# Patient Record
Sex: Female | Born: 1955 | Race: White | Hispanic: No
Health system: Southern US, Community
[De-identification: ages and names within clinical notes are randomized; demographics above are authoritative.]

---

## 2016-08-31 ENCOUNTER — Emergency Department (HOSPITAL_COMMUNITY): Payer: Medicare (Managed Care) | Admitting: Registered Nurse

## 2016-08-31 ENCOUNTER — Emergency Department (HOSPITAL_COMMUNITY): Payer: Medicare (Managed Care)

## 2016-08-31 ENCOUNTER — Emergency Department (HOSPITAL_COMMUNITY)
Admission: EM | Admit: 2016-08-31 | Discharge: 2016-08-31 | Disposition: A | Discharge status: Home / Self Care | Payer: Medicare (Managed Care) | Attending: Emergency Medicine | Admitting: Emergency Medicine

## 2016-08-31 ENCOUNTER — Encounter (HOSPITAL_COMMUNITY)
Admission: EM | Disposition: A | Discharge status: Home / Self Care | Payer: Self-pay | Source: Home / Self Care | Attending: Emergency Medicine

## 2016-08-31 DIAGNOSIS — Z96643 Presence of artificial hip joint, bilateral: Secondary | ICD-10-CM | POA: Insufficient documentation

## 2016-08-31 DIAGNOSIS — Z79899 Other long term (current) drug therapy: Secondary | ICD-10-CM | POA: Diagnosis not present

## 2016-08-31 DIAGNOSIS — F17200 Nicotine dependence, unspecified, uncomplicated: Secondary | ICD-10-CM | POA: Insufficient documentation

## 2016-08-31 DIAGNOSIS — W19XXXA Unspecified fall, initial encounter: Secondary | ICD-10-CM | POA: Insufficient documentation

## 2016-08-31 DIAGNOSIS — Y93E9 Activity, other interior property and clothing maintenance: Secondary | ICD-10-CM | POA: Diagnosis not present

## 2016-08-31 DIAGNOSIS — T84020A Dislocation of internal right hip prosthesis, initial encounter: Secondary | ICD-10-CM | POA: Diagnosis not present

## 2016-08-31 DIAGNOSIS — Y792 Prosthetic and other implants, materials and accessory orthopedic devices associated with adverse incidents: Secondary | ICD-10-CM | POA: Insufficient documentation

## 2016-08-31 DIAGNOSIS — T84028A Dislocation of other internal joint prosthesis, initial encounter: Secondary | ICD-10-CM

## 2016-08-31 DIAGNOSIS — Z09 Encounter for follow-up examination after completed treatment for conditions other than malignant neoplasm: Secondary | ICD-10-CM

## 2016-08-31 DIAGNOSIS — J449 Chronic obstructive pulmonary disease, unspecified: Secondary | ICD-10-CM | POA: Diagnosis not present

## 2016-08-31 DIAGNOSIS — Z96649 Presence of unspecified artificial hip joint: Secondary | ICD-10-CM

## 2016-08-31 DIAGNOSIS — Z7951 Long term (current) use of inhaled steroids: Secondary | ICD-10-CM | POA: Insufficient documentation

## 2016-08-31 DIAGNOSIS — M25559 Pain in unspecified hip: Secondary | ICD-10-CM

## 2016-08-31 DIAGNOSIS — S79911A Unspecified injury of right hip, initial encounter: Secondary | ICD-10-CM | POA: Diagnosis present

## 2016-08-31 DIAGNOSIS — S73004A Unspecified dislocation of right hip, initial encounter: Secondary | ICD-10-CM

## 2016-08-31 HISTORY — PX: HIP CLOSED REDUCTION: SHX983

## 2016-08-31 SURGERY — CLOSED MANIPULATION, JOINT, HIP
Anesthesia: General | Laterality: Right

## 2016-08-31 MED ORDER — FENTANYL CITRATE (PF) 100 MCG/2ML IJ SOLN
INTRAMUSCULAR | Status: AC
  Filled 2016-08-31: qty 2

## 2016-08-31 MED ORDER — HYDROMORPHONE HCL 2 MG/ML IJ SOLN
1.0000 mg | Freq: Once | INTRAMUSCULAR | Status: AC
  Administered 2016-08-31: 1 mg via INTRAVENOUS
  Filled 2016-08-31: qty 1

## 2016-08-31 MED ORDER — MEPERIDINE HCL 25 MG/ML IJ SOLN: 6.2500 mg | INTRAMUSCULAR | Status: DC | PRN

## 2016-08-31 MED ORDER — FENTANYL CITRATE (PF) 100 MCG/2ML IJ SOLN
50.0000 ug | Freq: Once | INTRAMUSCULAR | Status: AC
  Administered 2016-08-31: 50 ug via INTRAVENOUS
  Filled 2016-08-31: qty 2

## 2016-08-31 MED ORDER — PROPOFOL 10 MG/ML IV BOLUS
1.0000 mg/kg | Freq: Once | INTRAVENOUS | Status: DC
  Filled 2016-08-31: qty 20

## 2016-08-31 MED ORDER — HYDROMORPHONE HCL 2 MG/ML IJ SOLN: 1.0000 mg | Freq: Once | INTRAMUSCULAR | Status: DC

## 2016-08-31 MED ORDER — SUCCINYLCHOLINE CHLORIDE 200 MG/10ML IV SOSY
PREFILLED_SYRINGE | INTRAVENOUS | Status: DC | PRN
  Administered 2016-08-31: 80 mg via INTRAVENOUS

## 2016-08-31 MED ORDER — SUCCINYLCHOLINE CHLORIDE 200 MG/10ML IV SOSY
PREFILLED_SYRINGE | INTRAVENOUS | Status: AC
  Filled 2016-08-31: qty 10

## 2016-08-31 MED ORDER — PROPOFOL 10 MG/ML IV BOLUS
INTRAVENOUS | Status: AC
  Filled 2016-08-31: qty 20

## 2016-08-31 MED ORDER — PROMETHAZINE HCL 25 MG/ML IJ SOLN
6.2500 mg | INTRAMUSCULAR | Status: DC | PRN
  Filled 2016-08-31: qty 1

## 2016-08-31 MED ORDER — HYDROMORPHONE HCL 2 MG/ML IJ SOLN: 0.2500 mg | INTRAMUSCULAR | Status: DC | PRN

## 2016-08-31 MED ORDER — ONDANSETRON HCL 4 MG/2ML IJ SOLN
INTRAMUSCULAR | Status: AC
  Filled 2016-08-31: qty 2

## 2016-08-31 MED ORDER — LACTATED RINGERS IV SOLN
INTRAVENOUS | Status: DC | PRN
  Administered 2016-08-31: 15:00:00 via INTRAVENOUS

## 2016-08-31 MED ORDER — FENTANYL CITRATE (PF) 100 MCG/2ML IJ SOLN
INTRAMUSCULAR | Status: DC | PRN
  Administered 2016-08-31: 50 ug via INTRAVENOUS

## 2016-08-31 MED ORDER — LIDOCAINE HCL (CARDIAC) 20 MG/ML IV SOLN
INTRAVENOUS | Status: DC | PRN
  Administered 2016-08-31: 100 mg via INTRAVENOUS

## 2016-08-31 MED ORDER — PROPOFOL 10 MG/ML IV BOLUS
INTRAVENOUS | Status: DC | PRN
Start: 2016-08-31 — End: 2016-08-31
  Administered 2016-08-31: 140 mg via INTRAVENOUS

## 2016-08-31 MED ORDER — ONDANSETRON HCL 4 MG/2ML IJ SOLN
INTRAMUSCULAR | Status: DC | PRN
  Administered 2016-08-31: 4 mg via INTRAVENOUS

## 2016-08-31 MED ORDER — LIDOCAINE 2% (20 MG/ML) 5 ML SYRINGE
INTRAMUSCULAR | Status: AC
  Filled 2016-08-31: qty 5

## 2016-08-31 MED ORDER — PROPOFOL 10 MG/ML IV BOLUS
0.5000 mg/kg | Freq: Once | INTRAVENOUS | Status: DC
  Filled 2016-08-31: qty 20

## 2016-08-31 MED ORDER — LACTATED RINGERS IV SOLN: INTRAVENOUS | Status: DC

## 2016-08-31 SURGICAL SUPPLY — 6 items
BANDAGE ADH SHEER 1  50/CT (GAUZE/BANDAGES/DRESSINGS) IMPLANT
GAUZE SPONGE 4X4 12PLY STRL (GAUZE/BANDAGES/DRESSINGS) IMPLANT
GLOVE ORTHO TXT STRL SZ7.5 (GLOVE) IMPLANT
NDL SAFETY ECLIPSE 18X1.5 (NEEDLE) IMPLANT
NEEDLE HYPO 18GX1.5 SHARP (NEEDLE)
SYR CONTROL 10ML LL (SYRINGE) IMPLANT

## 2016-08-31 NOTE — Consult Note (Signed)
   Reason for Consult:4th or 5th time right THA dislocation, not able to be reduced under conscious sedation Referring Physician: Dalene SeltzerSchlossman MD  Hailey AltesLaura Robles is an 60 y.o. female.  HPI:  60 yo female THA done in [place] with 3 dislocations there, one in [place] with spinal or epidural anesthethia to reduce last time.  She was cleaning windows and fell with hip dislocation. On chronic percocet 10/325 3 or 4 a day for pain. Is scheduled for hip revision in [place] coming up. [place] in [place] when she fell.   No past medical history on file.Hx of bilat THA done in [place] . States she has chronic LBP  No past surgical history on file.see above  No family history on file. noncontributory . No Hx of anesthetic problems  Social History:  has no tobacco, alcohol, and drug history on file. positive for chronic narcotic pain medication use .   Allergies: No Known Allergies  Medications: I have reviewed the patient's current medications.  No results found for this or any previous visit (from the past 48 hour(s)).  Dg Hip Unilat With Pelvis 2-3 Views Right  Result Date: 08/31/2016 CLINICAL DATA:  Fall with right hip pain. EXAM: DG HIP (WITH OR WITHOUT PELVIS) 2-3V RIGHT COMPARISON:  None. FINDINGS: Examination demonstrates bilateral total hip arthroplasties. Left hip arthroplasty is unremarkable. There is complete dislocation of the femoral component of the right arthroplasty superolateral to the acetabular component. No definite fracture identified. Degenerative change of the spine. IMPRESSION: Complete superolateral dislocation of the femoral component of the right hip arthroplasty. Electronically Signed   By: Elberta Fortisaniel  Boyle M.D.   On: 08/31/2016 12:30    Review of Systems  Constitutional: Negative.   HENT:       Glasses  Gastrointestinal: Negative.   Musculoskeletal: Positive for falls.       Positive for chronic LBP, bilat THA  Skin: Negative.   Neurological:  Negative for headaches.  Psychiatric/Behavioral: Negative for memory loss.   Blood pressure 126/86, pulse 62, temperature 98.4 F (36.9 C), temperature source Oral, resp. rate 12, weight 125 lb (56.7 kg), SpO2 97 %. Physical Exam  Constitutional: She is oriented to person, place, and time. She appears well-developed and well-nourished.  HENT:  Head: Normocephalic.  Eyes: Pupils are equal, round, and reactive to light.  Neck: Normal range of motion.  Cardiovascular: Normal rate and regular rhythm.   Respiratory: Effort normal. She has no wheezes.  GI: Soft.  Musculoskeletal:  Right LE short and ER.   Neurological: She is oriented to person, place, and time.  Skin: Skin is warm and dry.  Psychiatric:  Sedated from medication given here in ER    Assessment/Plan: Will proceed with reduction under general anesthesia with full muscle relaxant since attempts by ER MD's was unsuccessful .  Discussed with pt and her daughter at bedside who added in Hx for pt.   They agree to proceed.   Eldred MangesMark C Yates 08/31/2016, 2:48 PM

## 2016-08-31 NOTE — ED Notes (Signed)
Patient is arousable and answers questions coherently.  VS stable.  Daughter sitting at bedside waiting on Orthopedic surgeon to arrive at bedside.

## 2016-08-31 NOTE — ED Notes (Signed)
Ortho tech called and respiratory called to notify of procedure.

## 2016-08-31 NOTE — ED Notes (Signed)
Bed: [id number] Expected date:  Expected time:  Means of arrival:  Comments: Hip dislocation

## 2016-08-31 NOTE — Brief Op Note (Signed)
08/31/2016  3:33 PM  PATIENT:  Hailey Robles  60 y.o. female  PRE-OPERATIVE DIAGNOSIS:  dislocated right total hip  POST-OPERATIVE DIAGNOSIS:  same  PROCEDURE:  Procedure(s): CLOSED MANIPULATION HIP (Right)  Closed reduction under general anesthesia  SURGEON:  Surgeon(s) and Role:    * Eldred MangesMark C Sutton Hirsch, MD - Primary  PHYSICIAN ASSISTANT:   ASSISTANTS: none   ANESTHESIA:   general  EBL:  No intake/output data recorded.  BLOOD ADMINISTERED:none  DRAINS: none   LOCAL MEDICATIONS USED:  NONE  SPECIMEN:  No Specimen  DISPOSITION OF SPECIMEN:  N/A  COUNTS:  NO closed reduction ,  no sponges  TOURNIQUET:  * No tourniquets in log *  DICTATION: .Dragon Dictation  PLAN OF CARE: Discharge to home after PACU  PATIENT DISPOSITION:  PACU - hemodynamically stable.   Delay start of Pharmacological VTE agent (>24hrs) due to surgical blood loss or risk of bleeding: not applicable

## 2016-08-31 NOTE — ED Notes (Signed)
Crash cart at bedside, etco2 set up on the monitor, patient on cardiac monitor, suction set up, Ambu bag set up and hooked to O2.Marland Kitchen.  Applied 3 L O2 [place] to patient and increased to 6L O2 [place].  Sedation at bedside preformed.  Present at the procedure was Dr. Dalene SeltzerSchlossman, Dr. Freida BusmanAllen,  OT - [place]     And Lance BoschStephanie Lyndzee Kliebert, RN.   Time out called 0136 60 mg Propofol given at 0137 by Dr. Dalene SeltzerSchlossman 20mg  Propofol given at 0138 50 mcg of fentanyl given at 0139  Procedure to reduce started at 0140  Dr. Freida BusmanAllen entered for assistance at 0148  40mg  of propofol give at 0151  Procedure ended at 0155.

## 2016-08-31 NOTE — ED Provider Notes (Signed)
[facility]-EMERGENCY DEPT Provider Note   CSN: [id number] Arrival date & time: 08/31/16  [id number]     History   Chief Complaint No chief complaint on file.   HPI Hailey Robles is a 60 y.o. female.  HPI   History of bilateral hip replacements, with 4 dislocations of the right side including today.  Was turning and reaching, and felt her hip suddenly pop out of place of severe pain and deformity. Pain is worse with movement. She'll he had mild relief with fentanyl with EMS. Denies any other concerns or injuries.   No past medical history on file.  Patient Active Problem List   Diagnosis Date Noted  . 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  OB History    No data available       Home Medications    Prior to Admission medications   Medication Sig Start Date End Date Taking? Authorizing Provider  albuterol (PROVENTIL HFA;VENTOLIN HFA) 108 (90 Base) MCG/ACT inhaler Inhale 2 puffs into the lungs every 6 (six) hours as needed for wheezing or shortness of breath.   Yes Historical Provider, MD  Fluticasone-Salmeterol (ADVAIR) 100-50 MCG/DOSE AEPB Inhale 1 puff into the lungs 2 (two) times daily.   Yes Historical Provider, MD  oxyCODONE-acetaminophen (PERCOCET) 10-325 MG tablet Take 1 tablet by mouth every 6 (six) hours as needed for pain.   Yes Historical Provider, MD  pregabalin (LYRICA) 200 MG capsule Take 200 mg by mouth at bedtime.   Yes Historical Provider, MD    Family History No family history on file.  Social History Social History  Substance Use Topics  . Smoking status: Not on file  . Smokeless tobacco: Not on file  . Alcohol use Not on file     Allergies   Patient has no known allergies.   Review of Systems Review of Systems  Constitutional: Negative for fever.  HENT: Negative for sore throat.   Eyes: Negative for visual disturbance.  Respiratory: Negative for cough and shortness of breath.     Cardiovascular: Negative for chest pain.  Gastrointestinal: Negative for abdominal pain.  Genitourinary: Negative for difficulty urinating.  Musculoskeletal: Positive for arthralgias. Negative for back pain and neck pain.  Skin: Negative for rash.  Neurological: Negative for syncope and headaches.     Physical Exam Updated Vital Signs BP 104/84 (BP Location: Right Arm)   Pulse 67   Temp 97.8 F (36.6 C)   Resp 18   Ht 5\' 2"  (1.575 m)   Wt 125 lb (56.7 kg)   LMP  (Approximate)   SpO2 98%   BMI 22.86 kg/m   Physical Exam  Constitutional: She is oriented to person, place, and time. She appears well-developed and well-nourished. No distress.  HENT:  Head: Normocephalic and atraumatic.  Eyes: EOM are normal.  Neck: Normal range of motion.  Cardiovascular: Normal rate, regular rhythm, normal heart sounds and intact distal pulses.  Exam reveals no gallop and no friction rub.   No murmur heard. Pulmonary/Chest: Effort normal. No respiratory distress. She has wheezes. She has no rales.  Abdominal: Soft. She exhibits no distension.  Musculoskeletal: She exhibits no edema.       Right hip: She exhibits decreased range of motion, decreased strength, tenderness and deformity.  Neurological: She is alert and oriented to person, place, and time.  Skin: Skin is warm and dry. No rash noted. She is not diaphoretic. No erythema.  Nursing note  and vitals reviewed.    ED Treatments / Results  Labs (all labs ordered are listed, but only abnormal results are displayed) Labs Reviewed - No data to display  EKG  EKG Interpretation None       Radiology Dg Hip Port Unilat With Pelvis 1v Right  Result Date: 08/31/2016 CLINICAL DATA:  Recent right hip relocation EXAM: DG HIP (WITH OR WITHOUT PELVIS) 1V PORT RIGHT COMPARISON:  Film from earlier in the same day FINDINGS: There has been relocation of the femoral component within the acetabular component. No acute bony or soft tissue  abnormality is noted. IMPRESSION: Interval relocation of the right femoral prosthesis. Electronically Signed   By: Alcide CleverMark  Lukens M.D.   On: 08/31/2016 17:28   Dg Hip Unilat With Pelvis 2-3 Views Right  Result Date: 08/31/2016 CLINICAL DATA:  Fall with right hip pain. EXAM: DG HIP (WITH OR WITHOUT PELVIS) 2-3V RIGHT COMPARISON:  None. FINDINGS: Examination demonstrates bilateral total hip arthroplasties. Left hip arthroplasty is unremarkable. There is complete dislocation of the femoral component of the right arthroplasty superolateral to the acetabular component. No definite fracture identified. Degenerative change of the spine. IMPRESSION: Complete superolateral dislocation of the femoral component of the right hip arthroplasty. Electronically Signed   By: Elberta Fortisaniel  Boyle M.D.   On: 08/31/2016 12:30    Procedures .Sedation Date/Time: 08/31/2016 6:54 PM Performed by: Alvira MondaySCHLOSSMAN, Keiran Gaffey Authorized by: Alvira MondaySCHLOSSMAN, Akayla Brass   Consent:    Consent obtained:  Written   Consent given by:  Patient Indications:    Procedure performed:  Dislocation reduction   Procedure necessitating sedation performed by:  Physician performing sedation   Intended level of sedation:  Moderate (conscious sedation) Pre-sedation assessment:    ASA classification: class 2 - patient with mild systemic disease     Neck mobility: normal     Mouth opening:  3 or more finger widths   Thyromental distance:  4 finger widths   Mallampati score:  II - soft palate, uvula, fauces visible   Pre-sedation assessments completed and reviewed: airway patency, cardiovascular function, hydration status, mental status, nausea/vomiting, pain level and respiratory function     History of difficult intubation: no   Immediate pre-procedure details:    Reassessment: Patient reassessed immediately prior to procedure     Reviewed: vital signs     Verified: bag valve mask available, emergency equipment available, intubation equipment available, IV  patency confirmed and oxygen available   Procedure details (see MAR for exact dosages):    Sedation start time:  08/31/2016 1:37 PM   Preoxygenation:  Nasal cannula   Sedation:  Propofol   Analgesia:  Fentanyl   Intra-procedure monitoring:  Blood pressure monitoring, cardiac monitor, continuous capnometry, continuous pulse oximetry, frequent LOC assessments and frequent vital sign checks   Intra-procedure management:  Airway repositioning   Sedation end time:  08/31/2016 1:55 PM Post-procedure details:    Post-sedation assessments completed and reviewed: airway patency, mental status and respiratory function     Patient is stable for discharge or admission: yes     Patient tolerance:  Tolerated well, no immediate complications Reduction of dislocation Date/Time: 08/31/2016 6:58 PM Performed by: Alvira MondaySCHLOSSMAN, Ahtziri Jeffries Authorized by: Alvira MondaySCHLOSSMAN, Mikayla Chiusano  Consent: Verbal consent obtained. Risks and benefits: risks, benefits and alternatives were discussed Consent given by: patient Required items: required blood products, implants, devices, and special equipment available Patient identity confirmed: verbally with patient Time out: Immediately prior to procedure a "time out" was called to verify the correct patient, procedure, equipment,  support staff and site/side marked as required. Preparation: Patient was prepped and draped in the usual sterile fashion. Local anesthesia used: no  Anesthesia: Local anesthesia used: no  Sedation: Patient sedated: yes Sedatives: propofol Sedation start date/time: 08/31/2016 1:39 PM Sedation end date/time: 08/31/2016 1:55 PM Patient tolerance: Patient tolerated the procedure well with no immediate complications Comments: Unable to obtain successful reduction    (including critical care time)  Medications Ordered in ED Medications  propofol (DIPRIVAN) 10 mg/mL bolus/IV push 56.7 mg ( Intravenous MAR Hold 08/31/16 1536)  propofol (DIPRIVAN) 10 mg/mL  bolus/IV push 28.4 mg ( Intravenous MAR Hold 08/31/16 1536)  meperidine (DEMEROL) injection 6.25-12.5 mg (not administered)  promethazine (PHENERGAN) injection 6.25-12.5 mg (not administered)  HYDROmorphone (DILAUDID) injection 0.3-0.5 mg (not administered)  lactated ringers infusion (not administered)  HYDROmorphone (DILAUDID) injection 1 mg (1 mg Intravenous Given 08/31/16 1030)  HYDROmorphone (DILAUDID) injection 1 mg (1 mg Intravenous Given 08/31/16 1150)  fentaNYL (SUBLIMAZE) injection 50 mcg (50 mcg Intravenous Given 08/31/16 0139)  fentaNYL (SUBLIMAZE) injection 50 mcg (50 mcg Intravenous Given 08/31/16 1436)     Initial Impression / Assessment and Plan / ED Course  I have reviewed the triage vital signs and the nursing notes.  Pertinent labs & imaging results that were available during my care of the patient were reviewed by me and considered in my medical decision making (see chart for details).  Clinical Course    60 year old female with a history of bilateral hip replacements with history of multiple dislocations of the right hip, presents with concern for right hip dislocation. Patient without other injuries, is hemodynamically stable, and neurovascularly intact. XR confirms dislocation.  Propofol sedation and reduction attempted, without success. Call Dr. Ophelia Charter, who came to the emergency department to evaluate the patient and will take her to the operating room for reduction under general anesthesia.   Final Clinical Impressions(s) / ED Diagnoses   Final diagnoses:  Hip pain  Dislocation of right hip, initial encounter [facility])    New Prescriptions Discharge Medication List as of 08/31/2016  4:56 PM       Alvira Monday, MD 08/31/16 1901

## 2016-08-31 NOTE — Anesthesia Postprocedure Evaluation (Signed)
Anesthesia Post Note  Patient: Hailey Robles  Procedure(s) Performed: Procedure(s) (LRB): CLOSED MANIPULATION HIP (Right)  Patient location during evaluation: PACU Anesthesia Type: General Level of consciousness: sedated and patient cooperative Pain management: pain level controlled Vital Signs Assessment: post-procedure vital signs reviewed and stable Respiratory status: spontaneous breathing Cardiovascular status: stable Anesthetic complications: no       Last Vitals:  Vitals:   08/31/16 1615 08/31/16 1625  BP: 112/70 112/70  Pulse: 72 69  Resp: 13 16  Temp: 36.7 C     Last Pain:  Vitals:   08/31/16 1459  TempSrc: Oral  PainSc: 6                  [name]

## 2016-08-31 NOTE — Transfer of Care (Signed)
Immediate Anesthesia Transfer of Care Note  Patient: Hailey Robles  Procedure(s) Performed: Procedure(s): CLOSED MANIPULATION HIP (Right)  Patient Location: PACU  Anesthesia Type:General  Level of Consciousness: awake, alert , oriented and patient cooperative  Airway & Oxygen Therapy: Patient Spontanous Breathing and Patient connected to face mask oxygen  Post-op Assessment: Report given to RN, Post -op Vital signs reviewed and stable and Patient moving all extremities  Post vital signs: Reviewed and stable  Last Vitals:  Vitals:   08/31/16 1415 08/31/16 1459  BP: 127/80 139/76  Pulse: 65 75  Resp: 16 20  Temp:  36.9 C    Last Pain:  Vitals:   08/31/16 1459  TempSrc: Oral  PainSc: 6       Patients Stated Pain Goal: 4 (08/31/16 1459)  Complications: None

## 2016-08-31 NOTE — Op Note (Signed)
Preop diagnosis: Right total hip arthroplasty dislocation  Postop diagnosis same  Procedure: Reduction of right total hip arthroplasty under general anesthesia.  Surgeon: Annell GreeningMark Chaise Mahabir M.D.  Anesthesia Gen. anesthesia with the succinylcholine.  Brief history 60 year old female lives in [place] has dislocated her right total hip arthroplasty at least 4 times. One time in [place] she required epidural or spinal anesthesia. She states that when she was in [place] it was able to be done under conscious sedation. Multiple multiple attempts by ER physician was unsuccessful in reducing her hip under conscious sedation including propofol. Patient was given propofol to the point where she began to drop her oxygen saturation.  Procedure: After induction of anesthesia was succinylcholine with the patient the spine position [place] upon the stretcher with her hip in the 9090 position and countertraction held with the assistant pushing on the anterior's. Will ask spine traction was pulled and after about the 15-20 seconds hip reduced with a loud clunk. Leg lengths were equal with good range of motion and portable x-ray confirmed good reduction. She is placed in a knee immobilizer. She is already scheduled for a revision surgery in [place] and will keep the knee immobilizer on and call her surgeon when she gets back to [place] after Christmas

## 2016-08-31 NOTE — ED Triage Notes (Signed)
Per EMS, pt is from home with complaints of right hip pain/dislocation. Pt reports cleaning windows when she slipped and fell. Pt denies LOC or hitting head. Pt had hip replacement 2 years ago and a also has a history of COPD. EMS administered 200 mcg of fentanyl. Pt is AOx4.

## 2016-08-31 NOTE — Discharge Instructions (Signed)
Keep knee immobilizer on , snug and centered at the knee. Do not remove. Call your Orthopedic Surgeon after Christmas when you get back to [place] . You already have pain medication , so no additional narcotic medication is needed.

## 2016-08-31 NOTE — Anesthesia Preprocedure Evaluation (Addendum)
Anesthesia Evaluation  Patient identified by MRN, date of birth, ID band Patient awake    Reviewed: Allergy & Precautions, NPO status , Patient's Chart, lab work & pertinent test results  Airway Mallampati: I  TM Distance: >3 FB Neck ROM: Full    Dental no notable dental hx. (+) Edentulous Upper   Pulmonary COPD, Current Smoker,    Pulmonary exam normal breath sounds clear to auscultation       Cardiovascular negative cardio ROS Normal cardiovascular exam Rhythm:Regular Rate:Normal     Neuro/Psych negative neurological ROS  negative psych ROS   GI/Hepatic negative GI ROS, Neg liver ROS,   Endo/Other  negative endocrine ROS  Renal/GU negative Renal ROS     Musculoskeletal negative musculoskeletal ROS (+)   Abdominal   Peds  Hematology negative hematology ROS (+)   Anesthesia Other Findings   Reproductive/Obstetrics negative OB ROS                            Anesthesia Physical Anesthesia Plan  ASA: II and emergent  Anesthesia Plan: General   Post-op Pain Management:    Induction: Intravenous  Airway Management Planned: Mask  Additional Equipment:   Intra-op Plan:   Post-operative Plan: Extubation in OR  Informed Consent: I have reviewed the patients History and Physical, chart, labs and discussed the procedure including the risks, benefits and alternatives for the proposed anesthesia with the patient or authorized representative who has indicated his/her understanding and acceptance.   Dental advisory given  Plan Discussed with: CRNA  Anesthesia Plan Comments:        Anesthesia Quick Evaluation

## 2016-09-02 ENCOUNTER — Encounter (HOSPITAL_COMMUNITY): Payer: Self-pay | Admitting: Orthopaedic Surgery

## 2017-11-27 IMAGING — CR DG HIP (WITH OR WITHOUT PELVIS) 2-3V*R*
3 series · 3 of 3 positions shown · non-contrast
Comparison: None.

CLINICAL DATA: Fall with right hip pain.

EXAM:
DG HIP (WITH OR WITHOUT PELVIS) 2-3V RIGHT

[w hip lat right]
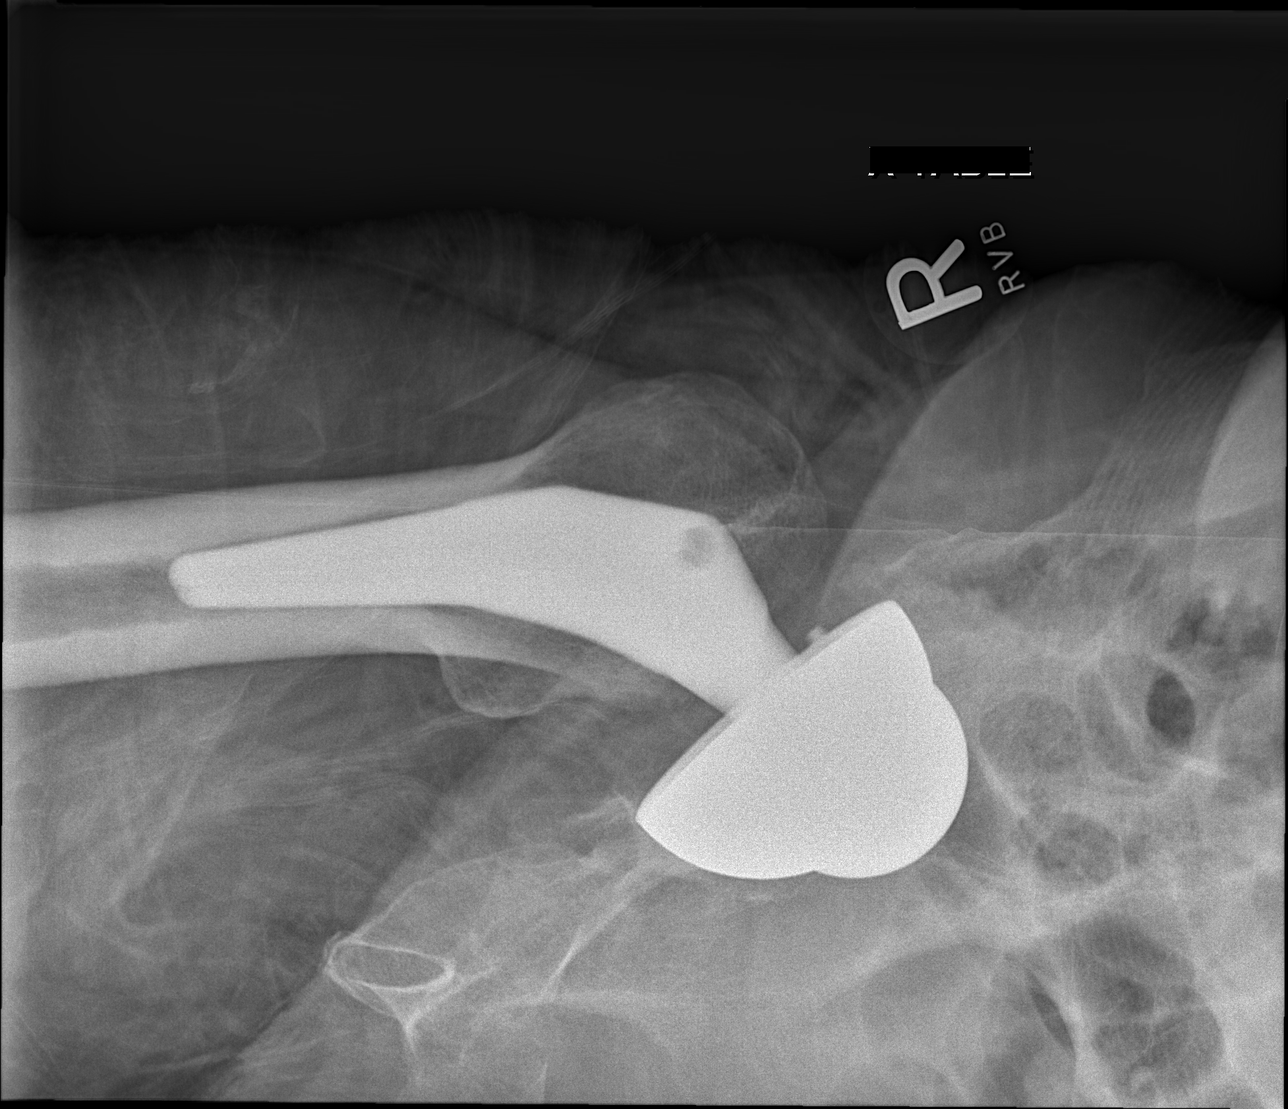

[x pelvis (1 of 2)]
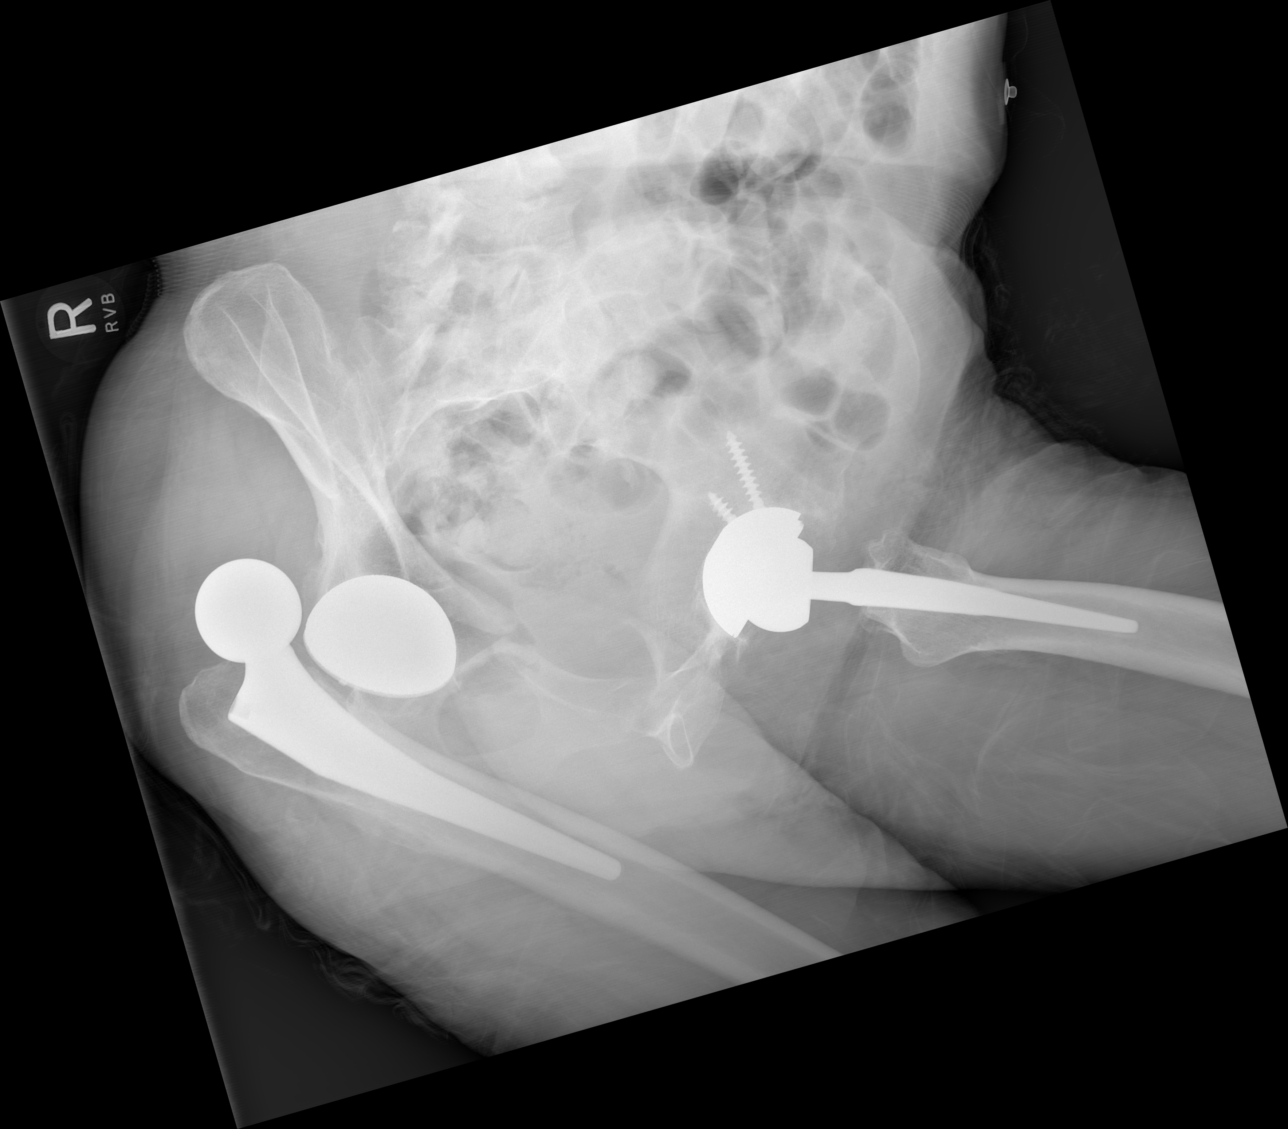

[x pelvis (2 of 2)]
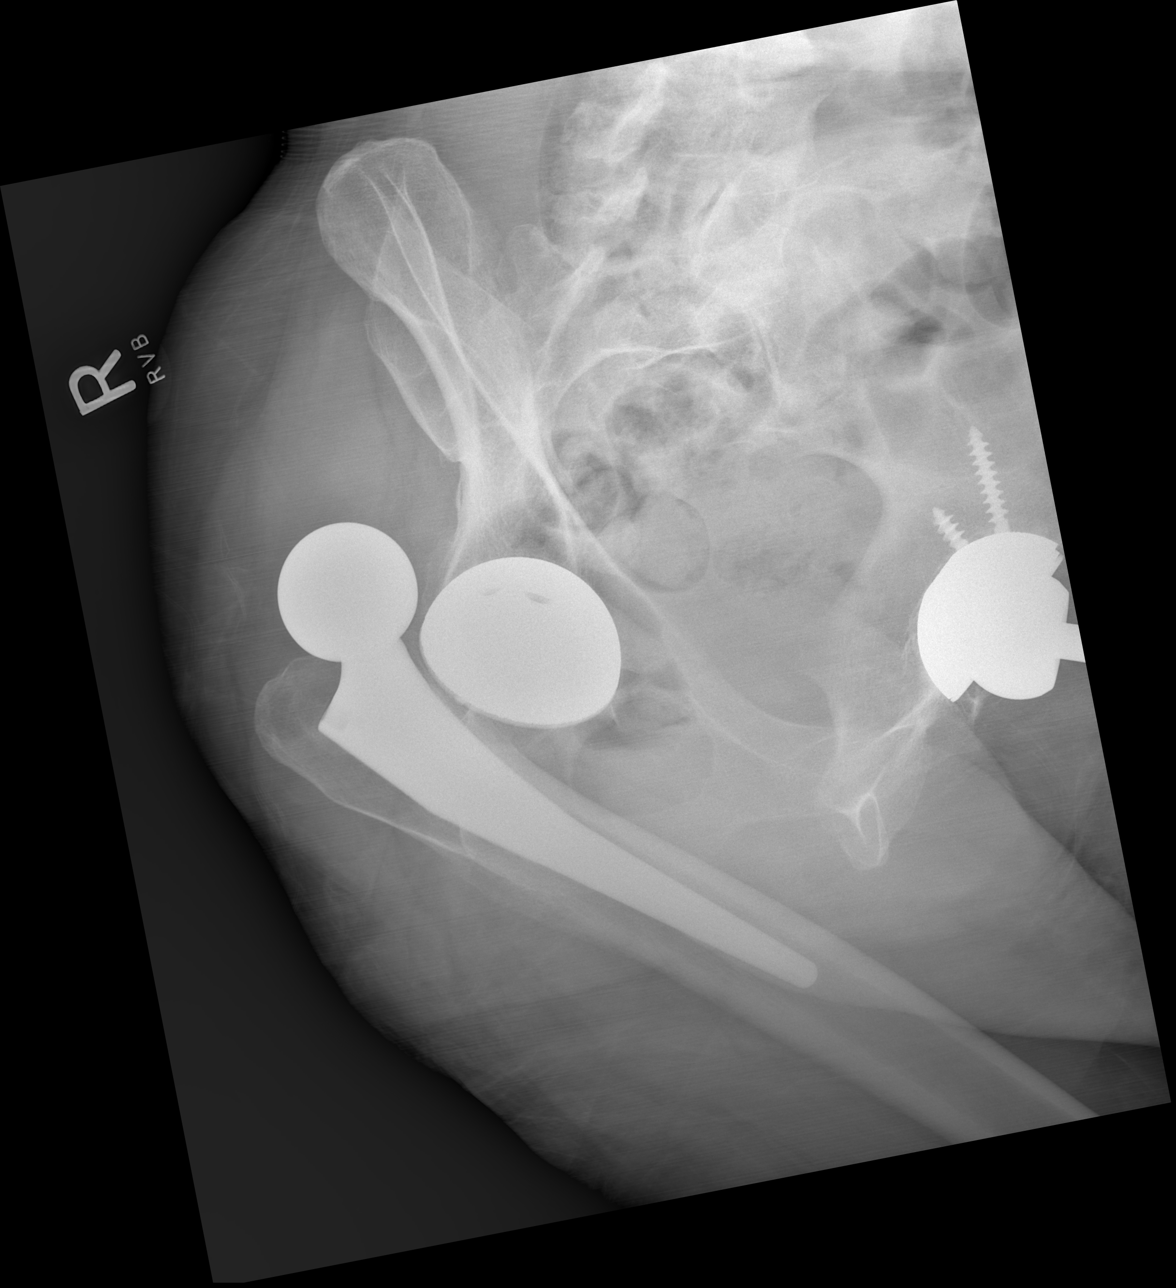

[3 of 3 positions shown; findings below may reference images not displayed]

FINDINGS: Examination demonstrates bilateral total hip arthroplasties. Left
hip arthroplasty is unremarkable. There is complete dislocation of
the femoral component of the right arthroplasty superolateral to the
acetabular component. No definite fracture identified. Degenerative
change of the spine.
IMPRESSION: Complete superolateral dislocation of the femoral component of the
right hip arthroplasty.

## 2017-11-27 IMAGING — DX DG HIP (WITH OR WITHOUT PELVIS) 1V PORT*R*
1 series · 1 of 1 positions shown · non-contrast
Comparison: Film from earlier in the same day

CLINICAL DATA: Recent right hip relocation

EXAM:
DG HIP (WITH OR WITHOUT PELVIS) 1V PORT RIGHT

[hip ap]
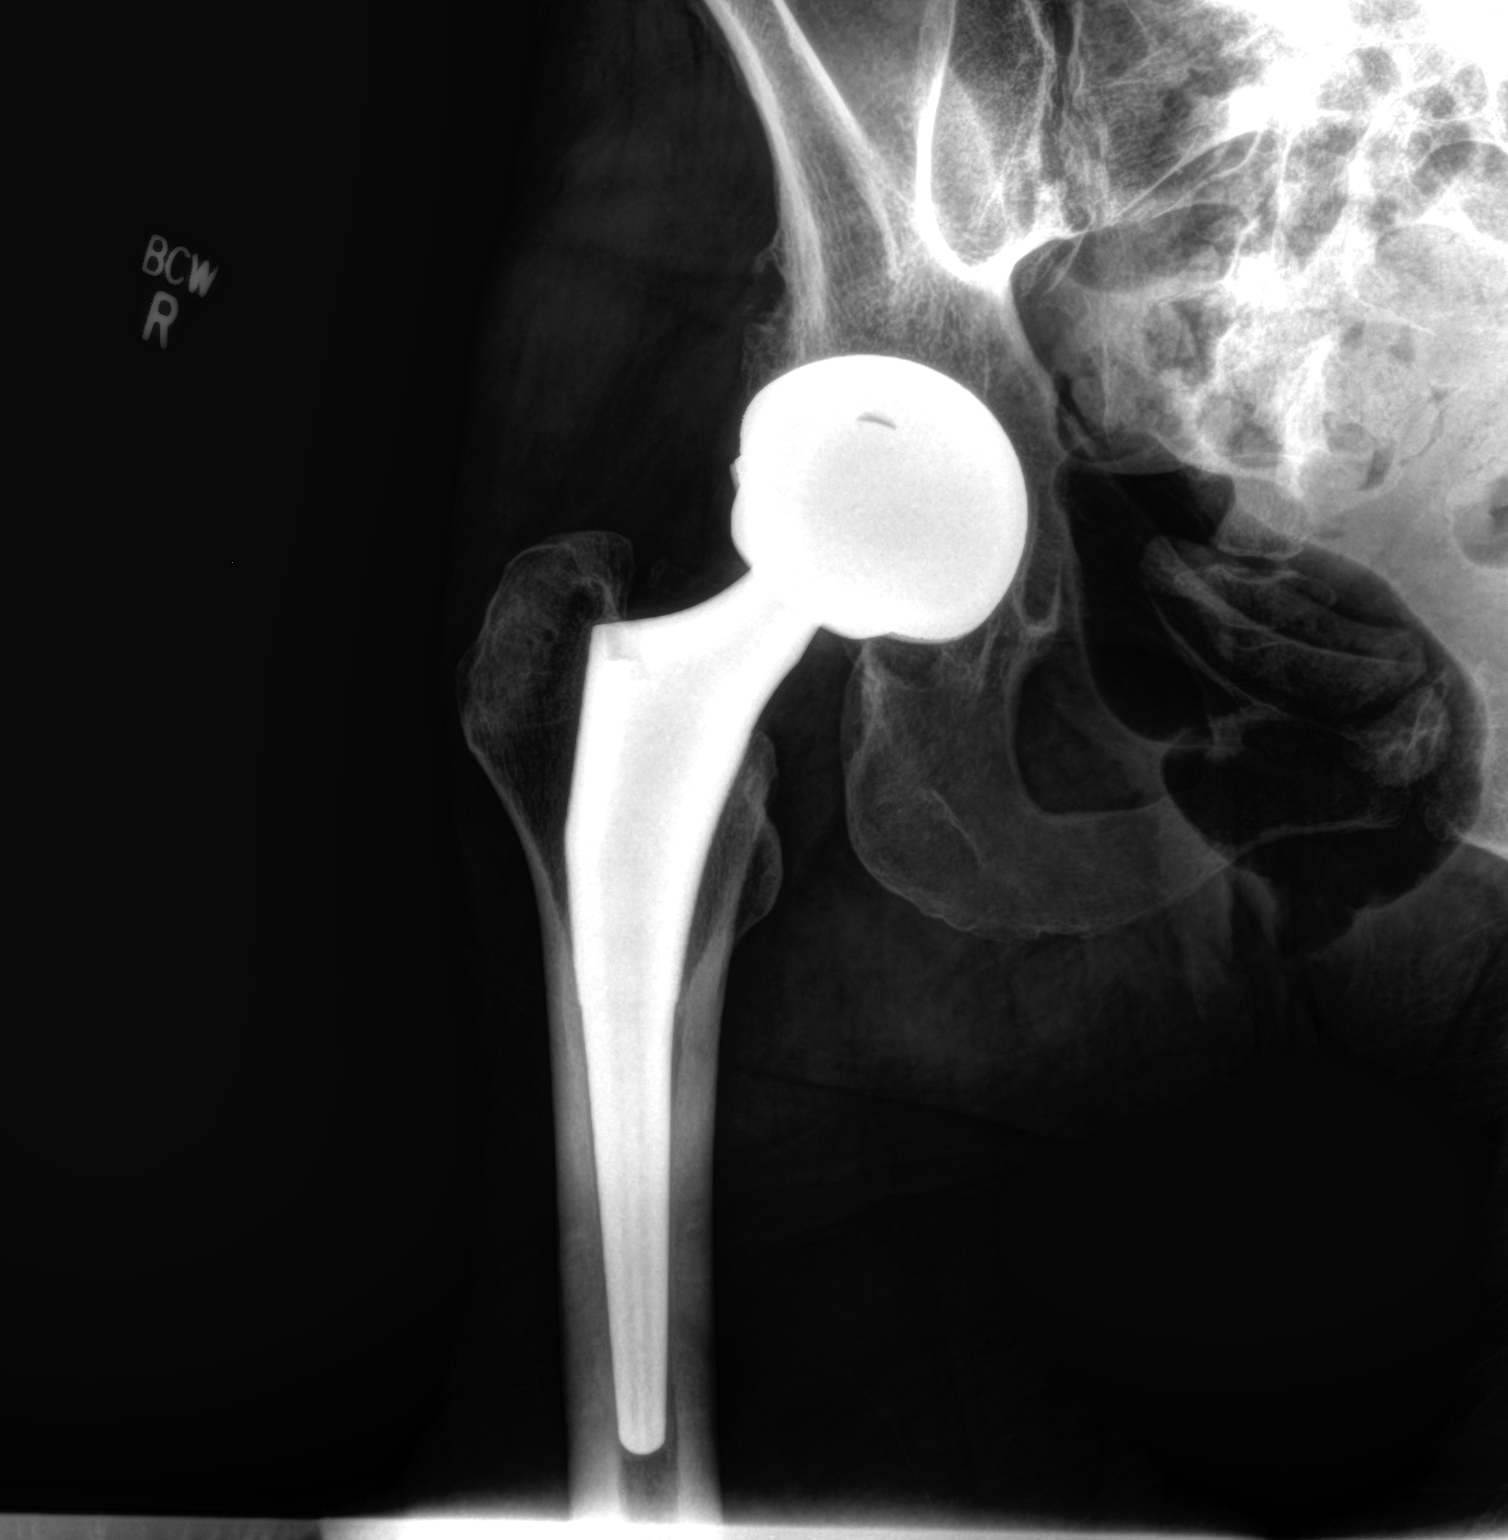

[1 of 1 positions shown; findings below may reference images not displayed]

FINDINGS: There has been relocation of the femoral component within the
acetabular component. No acute bony or soft tissue abnormality is
noted.
IMPRESSION: Interval relocation of the right femoral prosthesis.
